# Patient Record
Sex: Male | Born: 1970 | Hispanic: No | Marital: Single | Smoking: Never smoker
Health system: Southern US, Community
[De-identification: ages and names within clinical notes are randomized; demographics above are authoritative.]

## PROBLEM LIST (undated history)

## (undated) DIAGNOSIS — M109 Gout, unspecified: Secondary | ICD-10-CM

## (undated) DIAGNOSIS — I10 Essential (primary) hypertension: Secondary | ICD-10-CM

## (undated) DIAGNOSIS — E7800 Pure hypercholesterolemia, unspecified: Secondary | ICD-10-CM

---

## 2001-12-13 ENCOUNTER — Emergency Department (HOSPITAL_COMMUNITY): Admission: EM | Admit: 2001-12-13 | Discharge: 2001-12-13 | Payer: Self-pay

## 2016-01-22 ENCOUNTER — Encounter (HOSPITAL_BASED_OUTPATIENT_CLINIC_OR_DEPARTMENT_OTHER): Payer: Self-pay | Admitting: *Deleted

## 2016-01-22 ENCOUNTER — Emergency Department (HOSPITAL_BASED_OUTPATIENT_CLINIC_OR_DEPARTMENT_OTHER): Payer: 59

## 2016-01-22 ENCOUNTER — Emergency Department (HOSPITAL_BASED_OUTPATIENT_CLINIC_OR_DEPARTMENT_OTHER)
Admission: EM | Admit: 2016-01-22 | Discharge: 2016-01-22 | Disposition: A | Discharge status: Home / Self Care | Payer: 59 | Attending: Emergency Medicine | Admitting: Emergency Medicine

## 2016-01-22 DIAGNOSIS — I10 Essential (primary) hypertension: Secondary | ICD-10-CM | POA: Diagnosis not present

## 2016-01-22 DIAGNOSIS — F17200 Nicotine dependence, unspecified, uncomplicated: Secondary | ICD-10-CM | POA: Insufficient documentation

## 2016-01-22 DIAGNOSIS — M1000 Idiopathic gout, unspecified site: Secondary | ICD-10-CM | POA: Insufficient documentation

## 2016-01-22 DIAGNOSIS — M79675 Pain in left toe(s): Secondary | ICD-10-CM | POA: Diagnosis present

## 2016-01-22 HISTORY — DX: Gout, unspecified: M10.9

## 2016-01-22 HISTORY — DX: Essential (primary) hypertension: I10

## 2016-01-22 HISTORY — DX: Pure hypercholesterolemia, unspecified: E78.00

## 2016-01-22 LAB — BASIC METABOLIC PANEL
ANION GAP: 7 (ref 5–15)
BUN: 14 mg/dL (ref 6–20)
CO2: 23 mmol/L (ref 22–32)
Calcium: 9.2 mg/dL (ref 8.9–10.3)
Chloride: 108 mmol/L (ref 101–111)
Creatinine, Ser: 0.77 mg/dL (ref 0.61–1.24)
GFR calc Af Amer: >60 mL/min (ref >60)
GFR calc non Af Amer: >60 mL/min (ref >60)
Glucose, Bld: 112 mg/dL — ABNORMAL HIGH (ref 65–99)
Potassium: 4 mmol/L (ref 3.5–5.1)
Sodium: 138 mmol/L (ref 135–145)

## 2016-01-22 LAB — CBC
HCT: 39.4 % (ref 39.0–52.0)
Hemoglobin: 13.5 g/dL (ref 13.0–17.0)
MCH: 29.3 pg (ref 26.0–34.0)
MCHC: 34.3 g/dL (ref 30.0–36.0)
MCV: 85.7 fL (ref 78.0–100.0)
Platelets: 199 10*3/uL (ref 150–400)
RBC: 4.6 MIL/uL (ref 4.22–5.81)
RDW: 13.1 % (ref 11.5–15.5)
WBC: 9.5 10*3/uL (ref 4.0–10.5)

## 2016-01-22 LAB — URIC ACID: URIC ACID, SERUM: 9.5 mg/dL — AB (ref 4.4–7.6)

## 2016-01-22 MED ORDER — COLCHICINE 0.6 MG PO TABS: 0.6000 mg | ORAL_TABLET | Freq: Every day | ORAL | 0 refills | Status: DC

## 2016-01-22 MED ORDER — COLCHICINE 0.6 MG PO TABS: 0.6000 mg | ORAL_TABLET | Freq: Every day | ORAL | 0 refills | Status: AC

## 2016-01-22 MED ORDER — KETOROLAC TROMETHAMINE 15 MG/ML IJ SOLN
15.0000 mg | Freq: Once | INTRAMUSCULAR | Status: AC
  Administered 2016-01-22: 15 mg via INTRAVENOUS
  Filled 2016-01-22: qty 1

## 2016-01-22 MED ORDER — ALLOPURINOL 100 MG PO TABS: 100.0000 mg | ORAL_TABLET | Freq: Every day | ORAL | 1 refills | Status: AC

## 2016-01-22 MED ORDER — COLCHICINE 0.6 MG PO TABS
1.2000 mg | ORAL_TABLET | Freq: Once | ORAL | Status: AC
  Administered 2016-01-22: 1.2 mg via ORAL
  Filled 2016-01-22: qty 2

## 2016-01-22 MED ORDER — ALLOPURINOL 100 MG PO TABS: 100.0000 mg | ORAL_TABLET | Freq: Every day | ORAL | 1 refills | Status: DC

## 2016-01-22 NOTE — Discharge Instructions (Signed)
You were seen for gout flare. Take one colchicine 1 hour after your took the 1.2 mg colchicine, then daily to complete the course. You will also be given allopurinol to start after your acute flare has ended. If you have worsening pain, return to the ED for evaluation. Else follow with your PCP within the next week

## 2016-01-22 NOTE — ED Provider Notes (Signed)
[facility] Note   CSN: [id number] Arrival date & time: 01/22/16  0755     History   Chief Complaint Chief Complaint  Patient presents with  . Toe Pain       HPI Nicholas Mccullough is a 45 y.o. male with history of hypertension, gout presents for left great toe pain Pain initially started in his left ankle 4 days ago then per patient pain migrated to his left great toe one day ago. The pain worsened yesterday evening. He had difficulty sleeping. He tried taking with his wife's hydrocodone with little effect. Overnight he started having aching stiffness of his toe and difficulty bearing weight on his left foot because of the pain. He reports having gout flares in past, with his last one 2 years ago. He has had approximately for gout flares in total. He has taken cherry pills for gout, but has not recently because he ran out. He takes no other long-term suppressive therapy for gout Flares. He denies fever, redness, swelling, trauma to his foot. Otherwise he denies recent illnesses, shortness of breath, chest pain, nausea, vomiting, diarrhea, abdominal pain   Past Medical History:  Diagnosis Date  . Gout   . High cholesterol   . Hypertension     There are no active problems to display for this patient.   History reviewed. No pertinent surgical history.     Home Medications    Prior to Admission medications   Medication Sig Start Date End Date Taking? Authorizing Provider  allopurinol (ZYLOPRIM) 100 MG tablet Take 1 tablet (100 mg total) by mouth daily. 01/22/16   Bonney Aid, MD  colchicine 0.6 MG tablet Take 1 tablet (0.6 mg total) by mouth daily. 01/22/16   Bonney Aid, MD    Family History No family history on file.  Social History Social History  Substance Use Topics  . Smoking status: Never Smoker  . Smokeless tobacco: Current User  . Alcohol use Yes     Comment: several times/week     Allergies   Review of patient's allergies  indicates no known allergies.   Review of Systems Review of Systems  Constitutional: Negative.   HENT: Negative.   Eyes: Negative.   Respiratory: Negative.   Cardiovascular: Negative.   Gastrointestinal: Negative.   Endocrine: Negative.   Genitourinary: Negative.   Musculoskeletal:       Left great toe pain, stiffness  Skin: Negative.   Neurological: Negative.     Physical Exam Updated Vital Signs BP 127/86 (BP Location: Right Arm)   Pulse 60   Temp 97.8 F (36.6 C) (Oral)   Resp 14   Ht 6\' 1"  (1.854 m)   Wt 99.8 kg   SpO2 98%   BMI 29.03 kg/m   Physical Exam  Constitutional: He is oriented to person, place, and time. He appears well-developed and well-nourished.  HENT:  Head: Normocephalic and atraumatic.  Eyes: EOM are normal. Pupils are equal, round, and reactive to light.  Neck: Normal range of motion.  Cardiovascular: Normal rate, regular rhythm and normal heart sounds.   No murmur heard. Pulmonary/Chest: Effort normal and breath sounds normal. No respiratory distress.  Abdominal: Soft. Bowel sounds are normal. He exhibits no distension. There is no tenderness.  Musculoskeletal: Normal range of motion.  Left first metatarsal tenderness to palpation, no swelling, no erythema, reduced range of motion of the great toe.   Neurological: He is alert and oriented to person, place, and time. Abnormal reflex:  Skin: Skin is warm and dry.  Psychiatric: He has a normal mood and affect.     ED Treatments / Results  Labs (all labs ordered are listed, but only abnormal results are displayed) Labs Reviewed  BASIC METABOLIC PANEL - Abnormal; Notable for the following:       Result Value   Glucose, Bld 112 (*)    All other components within normal limits  URIC ACID - Abnormal; Notable for the following:    Uric Acid, Serum 9.5 (*)    All other components within normal limits  CBC    EKG  EKG Interpretation None       Radiology Dg Foot Complete  Left  Result Date: 01/22/2016 CLINICAL DATA:  Pt reports pain in LEFT foot and great toe since mid-week. Reports hx of gout and states he thinks this is a flare-up. EXAM: LEFT FOOT - COMPLETE 3+ VIEW COMPARISON:  None. FINDINGS: No fracture.  No bone lesion. Joints are normally spaced and aligned.  No arthropathic change. Soft tissues are unremarkable. IMPRESSION: Negative. Electronically Signed   By: Amie Portlandavid  Ormond M.D.   On: 01/22/2016 09:10    Procedures Procedures (including critical care time)  Medications Ordered in ED Medications  ketorolac (TORADOL) 15 MG/ML injection 15 mg (15 mg Intravenous Given 01/22/16 0858)  colchicine tablet 1.2 mg (1.2 mg Oral Given 01/22/16 0914)     Initial Impression / Assessment and Plan / ED Course  I have reviewed the triage vital signs and the nursing notes.  Pertinent labs & imaging results that were available during my care of the patient were reviewed by me and considered in my medical decision making (see chart for details).  Clinical Course    Uncomplicated ED course  Final Clinical Impressions(s) / ED Diagnoses   Final diagnoses:  Acute idiopathic gout, unspecified site   45 year old male with history of gout presents for left great toe pain consistent with gout flare. Uric acid elevated to 9.5, no evidence of trauma on x-ray of his toe, no evidence of infection. Patient was treated with colchicine and to continue allopurinol after this flare improves. Patient's pain improved with Toradol injection in the emergency room. He was counseled to follow with his primary care doctor regarding his gout. Emergency department return precautions were discussed. Patient was discharged in stable condition and all questions were answered to the patient's satisfaction.  New Prescriptions Discharge Medication List as of 01/22/2016  9:31 AM    START taking these medications   Details  colchicine 0.6 MG tablet Take 1 tablet (0.6 mg total) by mouth daily.,  Starting Sat 01/22/2016, Print        Willard Farquharson A. Kennon RoundsHaney MD, MS Family Medicine Resident PGY-3 Pager [phone]    Bonney AidAlyssa A Aiyonna Lucado, MD 01/22/16 1643    Marily MemosJason Mesner, MD 01/22/16 [phone]

## 2016-01-22 NOTE — ED Triage Notes (Signed)
Pt reports he takes medication for HTN and high cholesterol, but states he is unable to remember the names of the medications.

## 2016-01-22 NOTE — ED Triage Notes (Signed)
Pt reports pain in L foot and great toe since mid-week. Reports hx of gout and states he thinks this is a flare-up. Denies fever, n/v/d, numbness/tingling.

## 2017-12-10 IMAGING — DX DG FOOT COMPLETE 3+V*L*
3 series · 3 of 3 positions shown · non-contrast
Comparison: None.

CLINICAL DATA: Pt reports pain in LEFT foot and great toe since
mid-week. Reports hx of gout and states he thinks this is a
flare-up.

EXAM:
LEFT FOOT - COMPLETE 3+ VIEW

[foot ap]
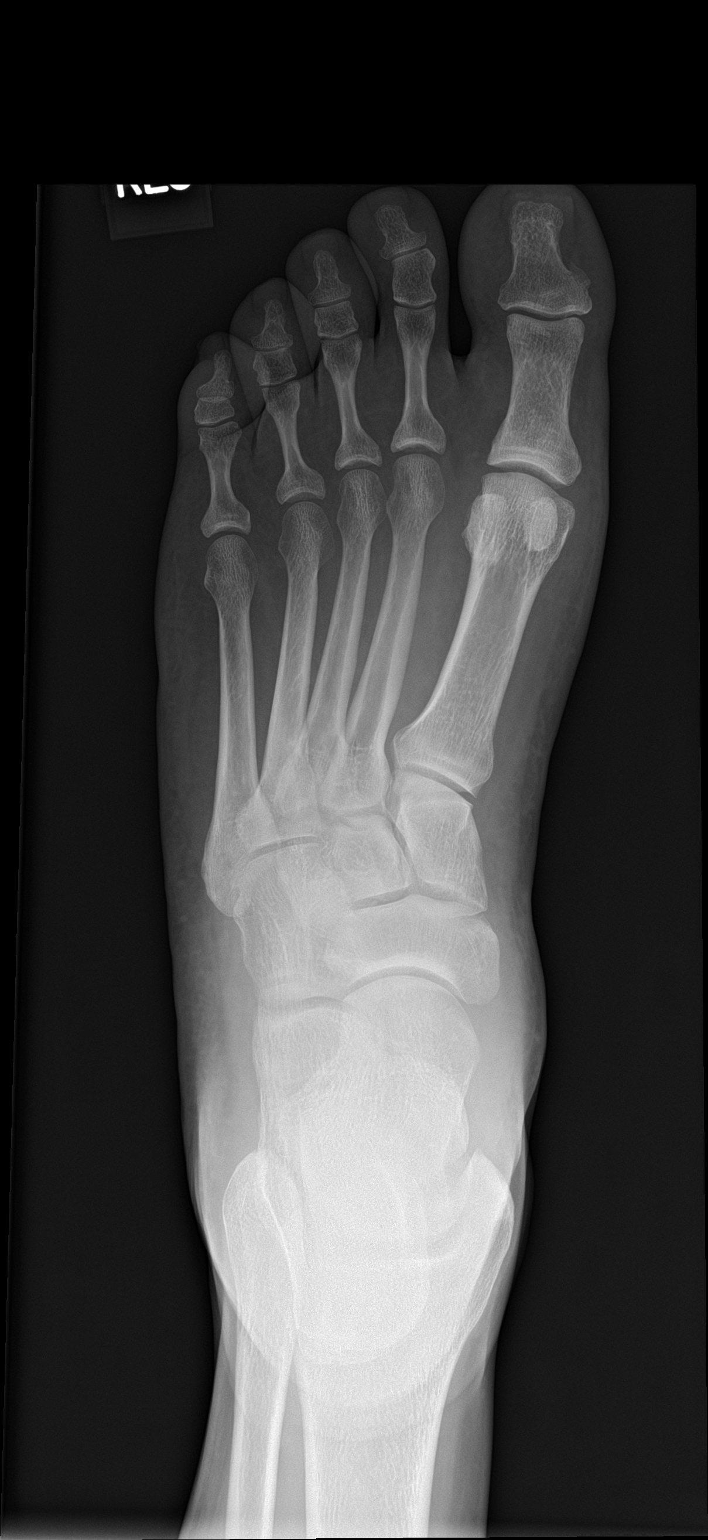

[foot obl]
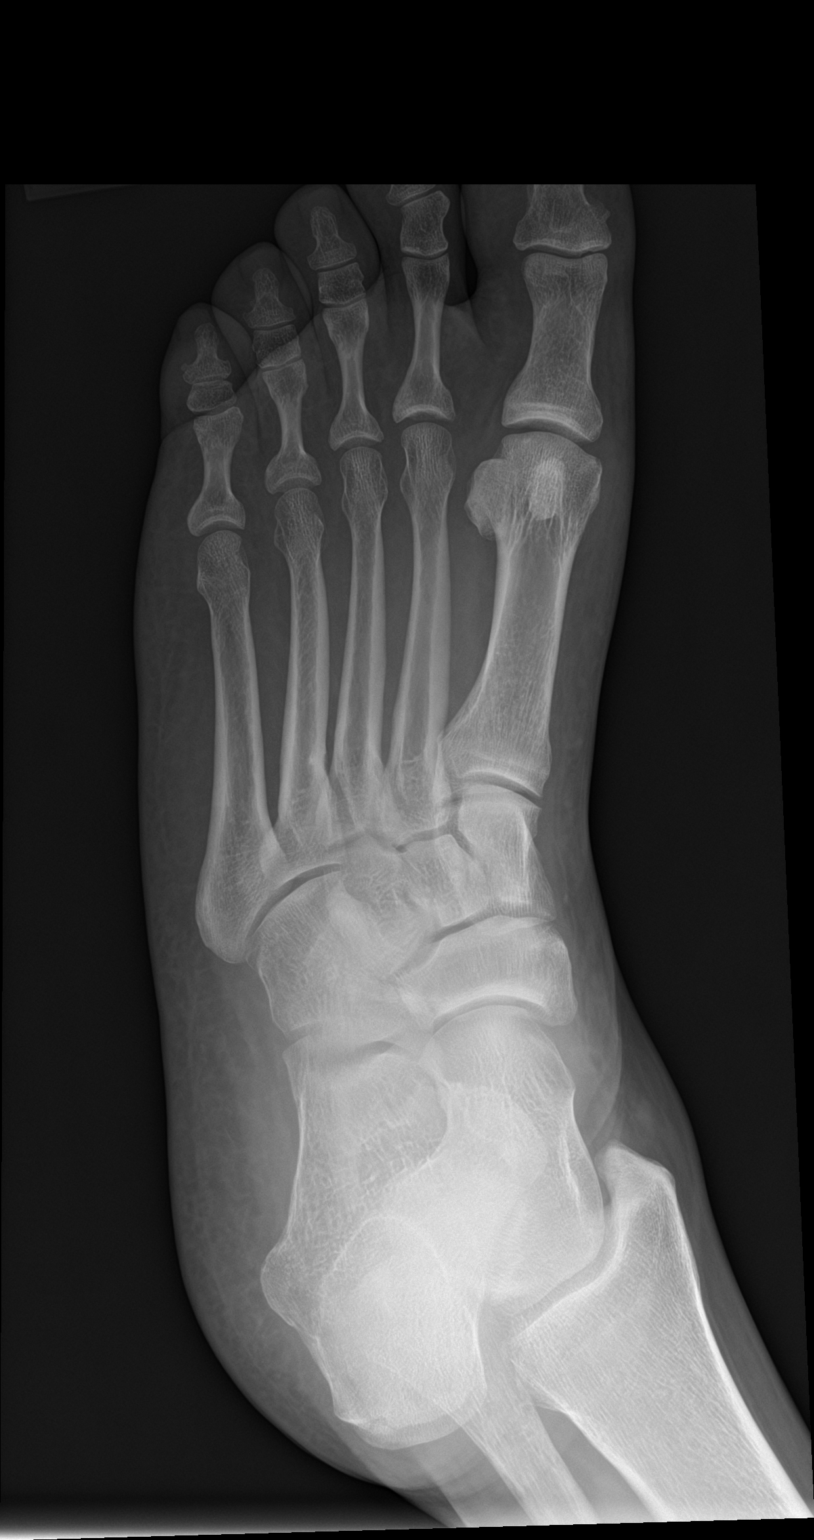

[foot lat]
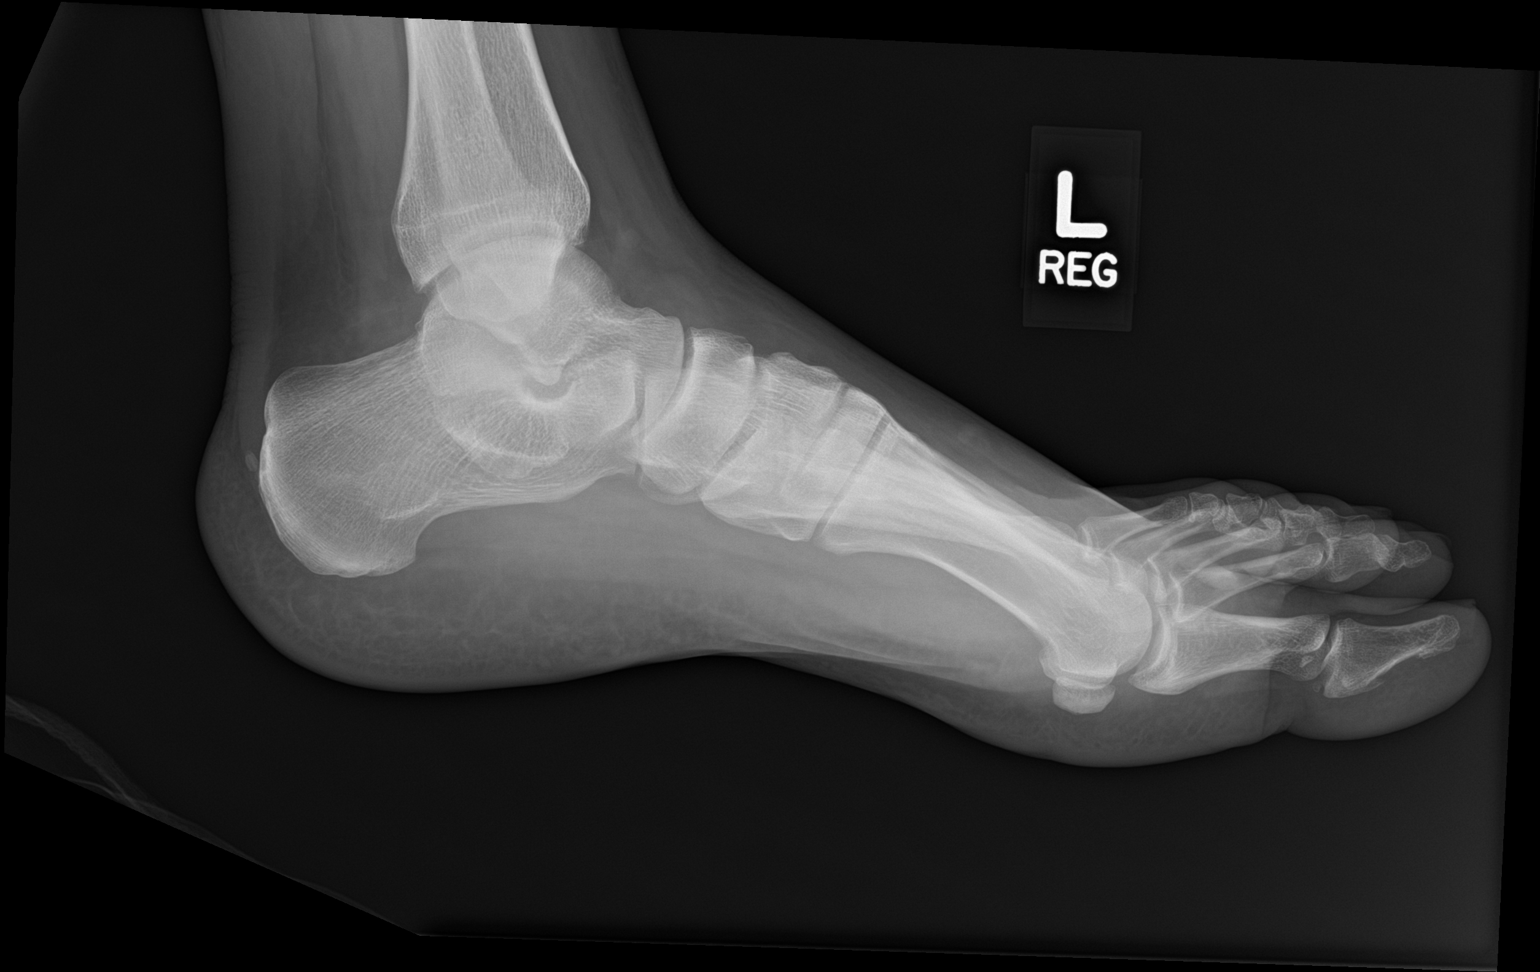

[3 of 3 positions shown; findings below may reference images not displayed]

FINDINGS: No fracture.  No bone lesion.

Joints are normally spaced and aligned.  No arthropathic change.

Soft tissues are unremarkable.
IMPRESSION: Negative.
